# Patient Record
Sex: Male | Born: 1965 | Race: White | Hispanic: No | Marital: Married | Smoking: Never smoker
Health system: Southern US, Community
[De-identification: ages and names within clinical notes are randomized; demographics above are authoritative.]

## PROBLEM LIST (undated history)

## (undated) HISTORY — PX: ABDOMINAL HERNIA REPAIR: SHX539

## (undated) HISTORY — PX: HERNIA REPAIR: SHX51

## (undated) HISTORY — PX: OTHER SURGICAL HISTORY: SHX169

## (undated) HISTORY — PX: VASECTOMY: SHX75

## (undated) HISTORY — PX: KNEE SURGERY: SHX244

---

## 1998-01-11 ENCOUNTER — Ambulatory Visit (HOSPITAL_COMMUNITY): Admission: RE | Admit: 1998-01-11 | Discharge: 1998-01-11 | Payer: Self-pay

## 1998-05-28 ENCOUNTER — Encounter: Payer: Self-pay | Admitting: Emergency Medicine

## 1998-05-28 ENCOUNTER — Emergency Department (HOSPITAL_COMMUNITY): Admission: EM | Admit: 1998-05-28 | Discharge: 1998-05-28 | Payer: Self-pay | Admitting: Emergency Medicine

## 1998-10-20 ENCOUNTER — Encounter: Payer: Self-pay | Admitting: Emergency Medicine

## 1998-10-20 ENCOUNTER — Emergency Department (HOSPITAL_COMMUNITY): Admission: EM | Admit: 1998-10-20 | Discharge: 1998-10-21 | Payer: Self-pay | Admitting: Emergency Medicine

## 2003-05-08 ENCOUNTER — Ambulatory Visit: Admission: RE | Admit: 2003-05-08 | Discharge: 2003-05-08 | Payer: Self-pay | Admitting: Family Medicine

## 2006-01-07 ENCOUNTER — Emergency Department (HOSPITAL_COMMUNITY): Admission: EM | Admit: 2006-01-07 | Discharge: 2006-01-08 | Payer: Self-pay | Admitting: Emergency Medicine

## 2006-08-03 ENCOUNTER — Emergency Department (HOSPITAL_COMMUNITY): Admission: EM | Admit: 2006-08-03 | Discharge: 2006-08-04 | Payer: Self-pay | Admitting: Emergency Medicine

## 2007-01-26 ENCOUNTER — Encounter: Admission: RE | Admit: 2007-01-26 | Discharge: 2007-01-26 | Payer: Self-pay | Admitting: Family Medicine

## 2007-12-05 ENCOUNTER — Encounter (INDEPENDENT_AMBULATORY_CARE_PROVIDER_SITE_OTHER): Payer: Self-pay | Admitting: *Deleted

## 2007-12-05 ENCOUNTER — Ambulatory Visit (HOSPITAL_COMMUNITY): Admission: RE | Admit: 2007-12-05 | Discharge: 2007-12-05 | Payer: Self-pay | Admitting: *Deleted

## 2008-06-13 ENCOUNTER — Encounter: Admission: RE | Admit: 2008-06-13 | Discharge: 2008-06-13 | Payer: Self-pay | Admitting: Family Medicine

## 2008-06-17 ENCOUNTER — Encounter: Admission: RE | Admit: 2008-06-17 | Discharge: 2008-06-17 | Payer: Self-pay | Admitting: Family Medicine

## 2008-08-28 ENCOUNTER — Ambulatory Visit (HOSPITAL_BASED_OUTPATIENT_CLINIC_OR_DEPARTMENT_OTHER): Admission: RE | Admit: 2008-08-28 | Discharge: 2008-08-28 | Payer: Self-pay | Admitting: General Surgery

## 2008-08-28 ENCOUNTER — Encounter (INDEPENDENT_AMBULATORY_CARE_PROVIDER_SITE_OTHER): Payer: Self-pay | Admitting: General Surgery

## 2010-04-25 ENCOUNTER — Encounter: Payer: Self-pay | Admitting: Family Medicine

## 2010-07-10 ENCOUNTER — Emergency Department (HOSPITAL_COMMUNITY)
Admission: EM | Admit: 2010-07-10 | Discharge: 2010-07-11 | Disposition: A | Discharge status: Home / Self Care | Payer: BC Managed Care – PPO | Attending: Emergency Medicine | Admitting: Emergency Medicine

## 2010-07-10 ENCOUNTER — Emergency Department (HOSPITAL_COMMUNITY): Payer: BC Managed Care – PPO

## 2010-07-10 DIAGNOSIS — S61209A Unspecified open wound of unspecified finger without damage to nail, initial encounter: Secondary | ICD-10-CM | POA: Insufficient documentation

## 2010-07-10 DIAGNOSIS — W261XXA Contact with sword or dagger, initial encounter: Secondary | ICD-10-CM | POA: Insufficient documentation

## 2010-07-10 DIAGNOSIS — W260XXA Contact with knife, initial encounter: Secondary | ICD-10-CM | POA: Insufficient documentation

## 2010-07-13 LAB — CBC
HCT: 43.4 % (ref 39.0–52.0)
Hemoglobin: 15.5 g/dL (ref 13.0–17.0)
MCHC: 35.6 g/dL (ref 30.0–36.0)
RBC: 4.82 MIL/uL (ref 4.22–5.81)
RDW: 13.3 % (ref 11.5–15.5)

## 2010-07-13 LAB — DIFFERENTIAL
Basophils Absolute: 0 10*3/uL (ref 0.0–0.1)
Eosinophils Relative: 1 % (ref 0–5)
Lymphocytes Relative: 31 % (ref 12–46)
Monocytes Absolute: 0.6 10*3/uL (ref 0.1–1.0)
Monocytes Relative: 11 % (ref 3–12)
Neutro Abs: 3.3 10*3/uL (ref 1.7–7.7)

## 2010-08-17 NOTE — Op Note (Signed)
Richard Burke, Richard Burke                 ACCOUNT NO.:  [id number]   MEDICAL RECORD NO.:  [id number]          PATIENT TYPE:  AMB   LOCATION:  DAY                          FACILITY:  [facility]   PHYSICIAN:  Alfonse Ras, MD   DATE OF BIRTH:  [birth year]   DATE OF PROCEDURE:  DATE OF DISCHARGE:                               OPERATIVE REPORT   PREOPERATIVE DIAGNOSIS:  Bilateral inguinal hernia.   POSTOPERATIVE DIAGNOSIS:  Bilateral inguinal hernia, bilateral direct.   PROCEDURE:  Bilateral inguinal hernia repair with 3 x 6 Ultrapro mesh.   SURGEON:  Alfonse Ras, M.D.   ANESTHESIA:  General.   DESCRIPTION OF PROCEDURE:  The patient was taken to the operating room  and placed in supine position.  After adequate general anesthesia was  induced using endotracheal tube, bilateral left inguinal regions were  shaved and prepped and draped in normal sterile fashion.  Using an  oblique incision over the left inguinal region, I dissected down through  the skin, subcutaneous tissue using Bovie electrocautery.  External  oblique fascia was opened along its fibers down to the external ring.  Ilioinguinal nerve was identified and retracted laterally.  Spermatic  cord was surrounded with a Penrose drain at the pubic tubercle.  Dissection of the inguinal ligament down to Cooper's ligament was  performed in a blunt fashion and medially under the external oblique  fascia.  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  A direct  inguinal hernia defect was identified and inserted in the cord.  There  was also an indirect hernia defect and sac on that which was dissected  out and ligated at the internal ring with a pursestring of Surgilon  suture.  The floor of Hesselbach triangle was repaired using interrupted  0 Surgilon sutures, approximating the transversalis fascia and muscle to  the inguinal ligament.  A piece of 3 x 6  Ultrapro mesh was then placed  over the repair and tacked medially over the pubic tubercle along the  inguinal ligament and transversalis muscle split and brought out lateral  to the cord.  This was all tacked in place.  The external oblique fascia  was closed with a running 3-0 Vicryl suture.  Skin was closed with  subcuticular 3-0 Monocryl.   I then turned my attention to the right side where a mirror incision was  made.  I dissected down in the similar fashion, the external oblique  fascia was opened along its fibers.  Ilioinguinal nerve was retracted  laterally.  Spermatic cord was surrounded with Penrose drain.  A very  small direct hernia defect was identified and closed primarily  approximating the inguinal ligament and Cooper's ligament to the  transversalis fascia with 3 interrupted sutures.  A piece of 3 x 6  Ultrapro mesh was then placed over the repair and tacked using running 2-  0 Prolene suture  to the tubercle inguinal ligament, transversalis fascia brought out  lateral to the spermatic cord and tacked laterally.  External oblique  fascia was closed with a running 3-0 Vicryl.  Skin was closed with  subcuticular 3-0 Monocryl.  Dermabond dressings were placed.  The  patient tolerated the procedure well and went to PACU in good condition.      Alfonse Ras, MD  Electronically Signed     KRE/MEDQ  D:  12/05/2007  T:  12/05/2007  Job:  [id number]

## 2010-08-17 NOTE — Op Note (Signed)
[name], Richard Burke                 ACCOUNT NO.:  [id number]   MEDICAL RECORD NO.:  [id number]          PATIENT TYPE:  AMB   LOCATION:  DSC                          FACILITY:  [facility]   PHYSICIAN:  Almond Lint, MD       DATE OF BIRTH:  1965/11/17   DATE OF PROCEDURE:  08/28/2008  DATE OF DISCHARGE:                               OPERATIVE REPORT   PREOPERATIVE DIAGNOSIS:  Left cervical lymphadenopathy.   POSTOPERATIVE DIAGNOSIS:  Enlarged L submandibular gland   PROCEDURE PERFORMED:  Excision of L neck mass.   SURGEON:  Almond Lint, MD   ASSISTANT:  None.   ANESTHESIA:  General and local.   FINDINGS:  Two masses, one of which was in the tail of the submandibular  gland.  Excision of left neck masses to pathology.   ESTIMATED BLOOD LOSS:  Minimal.   COMPLICATIONS:  None known.   PROCEDURE IN DETAIL:  [name] was identified in the holding area and  taken to the operating room air where he was placed supine on the  operating room table.  LMA anesthesia was induced.  His neck was turned  to the right and his left neck and ear were prepped and draped in a  sterile fashion.  The abnormality was identified over the side of the  sternocleidomastoid and an incision was marked in a skin crease.  This  was anesthetized with local anesthetic.  A 3-cm incision was made.  The  platysma was entered.  The most prominent area of abnormality was  identified in the tail of the submandibular gland.  This was dissected  sharply away from the greater auricular nerve.  The salivary gland  parenchyma was clipped.  The lesion was removed.  There was still an  area of palpable enlargement just inferior to this.  This was elevated  with an Allis and was removed with combination of sharp dissection and  was clipped on the vascular pedicles.  Again, the greater auricular  nerve was dissected off.  The marginal mandibular nerve was not seen.  There was one small area of venous bleeding which was  clipped.  The area  was irrigated and inspected for hemostasis.  There was none.  There was  also no evidence of lymphatic leakage.  The skin was then  closed using 3-0 Vicryl in a deep dermal fashion and a 4-0 Monocryl in a  running subcuticular fashion.  The wound was then dressed with  Dermabond.  The patient was awakened from anesthesia and taken to PACU  in stable condition.      Almond Lint, MD  Electronically Signed     FB/MEDQ  D:  08/28/2008  T:  08/29/2008  Job:  [id number]

## 2011-04-07 ENCOUNTER — Emergency Department (HOSPITAL_COMMUNITY): Payer: BC Managed Care – PPO

## 2011-04-07 ENCOUNTER — Encounter: Payer: Self-pay | Admitting: *Deleted

## 2011-04-07 ENCOUNTER — Emergency Department (HOSPITAL_COMMUNITY)
Admission: EM | Admit: 2011-04-07 | Discharge: 2011-04-07 | Disposition: A | Discharge status: Home / Self Care | Payer: BC Managed Care – PPO | Attending: Emergency Medicine | Admitting: Emergency Medicine

## 2011-04-07 ENCOUNTER — Other Ambulatory Visit: Payer: Self-pay

## 2011-04-07 DIAGNOSIS — M25519 Pain in unspecified shoulder: Secondary | ICD-10-CM | POA: Insufficient documentation

## 2011-04-07 DIAGNOSIS — R0789 Other chest pain: Secondary | ICD-10-CM

## 2011-04-07 DIAGNOSIS — M542 Cervicalgia: Secondary | ICD-10-CM | POA: Insufficient documentation

## 2011-04-07 DIAGNOSIS — R071 Chest pain on breathing: Secondary | ICD-10-CM | POA: Insufficient documentation

## 2011-04-07 LAB — POCT I-STAT TROPONIN I: Troponin i, poc: 0 ng/mL (ref 0.00–0.08)

## 2011-04-07 LAB — COMPREHENSIVE METABOLIC PANEL
Albumin: 4.3 g/dL (ref 3.5–5.2)
BUN: 16 mg/dL (ref 6–23)
Calcium: 10 mg/dL (ref 8.4–10.5)
Creatinine, Ser: 1.18 mg/dL (ref 0.50–1.35)
GFR calc Af Amer: 85 mL/min — ABNORMAL LOW (ref >90)
Glucose, Bld: 97 mg/dL (ref 70–99)
Total Protein: 7.6 g/dL (ref 6.0–8.3)

## 2011-04-07 LAB — CBC
HCT: 46 % (ref 39.0–52.0)
Hemoglobin: 15.4 g/dL (ref 13.0–17.0)
MCH: 29.4 pg (ref 26.0–34.0)
MCHC: 33.5 g/dL (ref 30.0–36.0)
RDW: 13.1 % (ref 11.5–15.5)

## 2011-04-07 LAB — TROPONIN I
Troponin I: <0.3 ng/mL (ref <0.30)
Troponin I: <0.3 ng/mL (ref <0.30)

## 2011-04-07 MED ORDER — ASPIRIN 325 MG PO TABS
325.0000 mg | ORAL_TABLET | ORAL | Status: AC
  Administered 2011-04-07: 325 mg via ORAL
  Filled 2011-04-07: qty 1

## 2011-04-07 MED ORDER — DIAZEPAM 5 MG PO TABS: 5.0000 mg | ORAL_TABLET | Freq: Two times a day (BID) | ORAL | Status: AC

## 2011-04-07 MED ORDER — MORPHINE SULFATE 4 MG/ML IJ SOLN
4.0000 mg | Freq: Once | INTRAMUSCULAR | Status: AC
  Administered 2011-04-07: 4 mg via INTRAVENOUS
  Filled 2011-04-07: qty 1

## 2011-04-07 MED ORDER — NITROGLYCERIN 0.4 MG SL SUBL
0.4000 mg | SUBLINGUAL_TABLET | SUBLINGUAL | Status: DC | PRN
  Administered 2011-04-07 (×3): 0.4 mg via SUBLINGUAL

## 2011-04-07 MED ORDER — HYDROCODONE-ACETAMINOPHEN 5-325 MG PO TABS: 1.0000 | ORAL_TABLET | ORAL | Status: AC | PRN

## 2011-04-07 MED ORDER — NITROGLYCERIN 0.4 MG SL SUBL
SUBLINGUAL_TABLET | SUBLINGUAL | Status: AC
  Administered 2011-04-07: 0.4 mg via SUBLINGUAL
  Filled 2011-04-07: qty 25

## 2011-04-07 NOTE — ED Notes (Signed)
Pt given 3 nitro tablets. C/o pain 5/10 when last nitro tablet given. Sharp pain.

## 2011-04-07 NOTE — ED Notes (Signed)
Pt states "driving to [place], began having pain in my left upper arm, left upper back hurts to grit my teeth and left cp"; pt denies n/v/diaphoresis

## 2011-04-07 NOTE — ED Notes (Signed)
Patient given discharge instructions, information, prescriptions, and diet order. Patient states that they adequately understand discharge information given and to return to ED if symptoms return or worsen.     

## 2011-04-07 NOTE — ED Provider Notes (Signed)
11:53 AM  Date: 04/07/2011  Rate: 59  Rhythm: sinus bradycardia  QRS Axis: normal  Intervals: normal  ST/T Wave abnormalities: normal  Conduction Disutrbances:none  Narrative Interpretation: Normal EKG  Old EKG Reviewed: unchanged    Carleene Cooper III, MD 04/07/11 1154

## 2011-04-07 NOTE — ED Provider Notes (Signed)
History     CSN: [id number]  Arrival date & time 04/07/11  1018   First MD Initiated Contact with Patient 04/07/11 1415      Chief Complaint  Patient presents with  . Neck Pain    left  . Shoulder Pain    left  . Chest Pain   HPI Patient complains of left sided chest discomfort, neck pain, left arm pain that started while driving to work today. Reports that it is worse with movement. No diaphoresis, nausea, vomiting, fevers, cough or shortness of breath. Reports pain is worse with palpation. No recent travel or surgeries. Patient followed by Dr. Laurine Blazer, no hyperlipidemia, no smoking, no hypertension. Denies any exertional chest pain. Denies any previous history of chest pain.   History reviewed. No pertinent past medical history.  Past Surgical History  Procedure Date  . Abdominal hernia repair   . Knee surgery     right  . Salivary gland removal   . Vasectomy     History reviewed. No pertinent family history.  History  Substance Use Topics  . Smoking status: Never Smoker   . Smokeless tobacco: Not on file  . Alcohol Use: No      Review of Systems  Constitutional: Negative for fever, chills, diaphoresis and appetite change.  HENT: Negative for neck pain.   Eyes: Negative for photophobia and visual disturbance.  Respiratory: Negative for cough, chest tightness and shortness of breath.   Cardiovascular: Negative for chest pain.  Gastrointestinal: Negative for nausea, vomiting, abdominal pain, constipation and blood in stool.  Genitourinary: Negative for flank pain.  Musculoskeletal: Negative for back pain, joint swelling and gait problem.  Skin: Negative for rash.  Neurological: Negative for dizziness, facial asymmetry, weakness and numbness.  All other systems reviewed and are negative.    Allergies  Review of patient's allergies indicates no known allergies.  Home Medications   Current Outpatient Rx  Name Route Sig Dispense Refill  . NAPROXEN SODIUM 220  MG PO TABS Oral Take 220 mg by mouth 2 (two) times daily with a meal. pain       BP 129/90  Pulse 80  Temp(Src) 97.7 F (36.5 C) (Oral)  Resp 20  Wt 170 lb (77.111 kg)  SpO2 99%  Physical Exam  Nursing note and vitals reviewed. Constitutional: He is oriented to person, place, and time. He appears well-developed and well-nourished. No distress.  HENT:  Head: Normocephalic and atraumatic.  Mouth/Throat: Oropharynx is clear and moist.  Eyes: EOM are normal. Pupils are equal, round, and reactive to light.  Neck: Normal range of motion. Neck supple.  Cardiovascular: Normal rate, regular rhythm, normal heart sounds and intact distal pulses.  Exam reveals no gallop and no friction rub.   No murmur heard. Pulmonary/Chest: Effort normal and breath sounds normal. No respiratory distress. He has no wheezes. He exhibits tenderness.       Tender to palpation to left shoulder with ROM. Tender to palpation to the left/right chest to palpation.   Abdominal: Soft. Bowel sounds are normal. There is no tenderness. There is no rebound and no guarding.  Musculoskeletal: Normal range of motion. He exhibits tenderness. He exhibits no edema.       Full sensation.   Neurological: He is alert and oriented to person, place, and time. He displays normal reflexes. No cranial nerve deficit or sensory deficit. He exhibits normal muscle tone. He displays a negative Romberg sign. Coordination and gait normal. GCS eye subscore is 4. GCS  verbal subscore is 5. GCS motor subscore is 6. He displays no Babinski's sign on the right side. He displays no Babinski's sign on the left side.       Normal finger to nose testing. Normal grip. No pronator drift. No nystagmus on examination.  Skin: Skin is warm and dry. No rash noted. He is not diaphoretic. No erythema. No pallor.  Psychiatric: He has a normal mood and affect. His behavior is normal. Judgment and thought content normal.    ED Course  Procedures (including critical  care time)  Patient seen and evaluated.  VSS reviewed. . Nursing notes reviewed. Discussed with attending physician. Initial testing ordered. Will monitor the patient closely. They agree with the treatment plan and diagnosis.   Results for orders placed during the hospital encounter of 04/07/11  CBC      Component Value Range   WBC 5.0  4.0 - 10.5 (K/uL)   RBC 5.24  4.22 - 5.81 (MIL/uL)   Hemoglobin 15.4  13.0 - 17.0 (g/dL)   HCT 96.2  95.2 - 84.1 (%)   MCV 87.8  78.0 - 100.0 (fL)   MCH 29.4  26.0 - 34.0 (pg)   MCHC 33.5  30.0 - 36.0 (g/dL)   RDW 32.4  40.1 - 02.7 (%)   Platelets 235  150 - 400 (K/uL)  COMPREHENSIVE METABOLIC PANEL      Component Value Range   Sodium 136  135 - 145 (mEq/L)   Potassium 4.2  3.5 - 5.1 (mEq/L)   Chloride 101  96 - 112 (mEq/L)   CO2 25  19 - 32 (mEq/L)   Glucose, Bld 97  70 - 99 (mg/dL)   BUN 16  6 - 23 (mg/dL)   Creatinine, Ser 2.53  0.50 - 1.35 (mg/dL)   Calcium 66.4  8.4 - 10.5 (mg/dL)   Total Protein 7.6  6.0 - 8.3 (g/dL)   Albumin 4.3  3.5 - 5.2 (g/dL)   AST 17  0 - 37 (U/L)   ALT 26  0 - 53 (U/L)   Alkaline Phosphatase 47  39 - 117 (U/L)   Total Bilirubin 0.6  0.3 - 1.2 (mg/dL)   GFR calc non Af Amer 74 (*) >90 (mL/min)   GFR calc Af Amer 85 (*) >90 (mL/min)  TROPONIN I      Component Value Range   Troponin I <0.30  <0.30 (ng/mL)  POCT I-STAT TROPONIN I      Component Value Range   Troponin i, poc 0.00  0.00 - 0.08 (ng/mL)   Comment 3            Dg Chest 2 View  04/07/2011  *RADIOLOGY REPORT*  Clinical Data: Chest pain.  CHEST - 2 VIEW  Comparison: Chest x-ray 10/16/2010.  Findings: The cardiac silhouette, mediastinal and hilar contours are within normal limits and stable.  The lungs are clear.  No pleural effusion.  The bony thorax is intact.  IMPRESSION: No acute cardiopulmonary findings.  Original Report Authenticated By: P. Loralie Champagne, M.D.    Patient seen and re-evaluated. Resting comfortably. VSS stable. NAD. Patient notified  of testing results. Stated agreement and understanding. Patient stated understanding to treatment plan and diagnosis. 3 hour cardiac marker is negative.    PERC negative. No recent travel or surgery  6:05 PM patient is chest pain free at this time and is requesting to go home. All labs and x-ray are WNL. No abnormalities on ECG  MDM  Chest pain, 0, 1, 3 hour cardiac marker are negative. No exertional chest pain, no SOB or diaphoresis. Reproducible pain, consistent with musculoskeletal pain. No pain with inspiration, PERC negative. PE unlikely at this time. Advised patient of warning signs to return. Stated agreement and understanding.     Demetrius Charity, PA 04/07/11 1805

## 2011-04-08 NOTE — ED Provider Notes (Signed)
Medical screening examination/treatment/procedure(s) were performed by non-physician practitioner and as supervising physician I was immediately available for consultation/collaboration.  Kailie Polus L Raider Valbuena, MD 04/08/11 0029 

## 2011-04-19 ENCOUNTER — Ambulatory Visit: Payer: BC Managed Care – PPO | Attending: Family Medicine

## 2011-04-19 DIAGNOSIS — IMO0001 Reserved for inherently not codable concepts without codable children: Secondary | ICD-10-CM | POA: Insufficient documentation

## 2011-04-19 DIAGNOSIS — M542 Cervicalgia: Secondary | ICD-10-CM | POA: Insufficient documentation

## 2011-04-19 DIAGNOSIS — R5381 Other malaise: Secondary | ICD-10-CM | POA: Insufficient documentation

## 2011-04-22 ENCOUNTER — Encounter: Payer: BC Managed Care – PPO | Admitting: Physical Therapy

## 2011-04-26 ENCOUNTER — Ambulatory Visit: Payer: BC Managed Care – PPO | Admitting: Physical Therapy

## 2011-05-05 ENCOUNTER — Encounter: Payer: BC Managed Care – PPO | Admitting: Physical Therapy

## 2012-08-31 ENCOUNTER — Other Ambulatory Visit: Payer: Self-pay | Admitting: Otolaryngology

## 2013-10-19 ENCOUNTER — Encounter (HOSPITAL_BASED_OUTPATIENT_CLINIC_OR_DEPARTMENT_OTHER): Payer: Self-pay | Admitting: Emergency Medicine

## 2013-10-19 ENCOUNTER — Emergency Department (HOSPITAL_BASED_OUTPATIENT_CLINIC_OR_DEPARTMENT_OTHER)
Admission: EM | Admit: 2013-10-19 | Discharge: 2013-10-19 | Disposition: A | Discharge status: Home / Self Care | Payer: BC Managed Care – PPO | Attending: Emergency Medicine | Admitting: Emergency Medicine

## 2013-10-19 DIAGNOSIS — W268XXA Contact with other sharp object(s), not elsewhere classified, initial encounter: Secondary | ICD-10-CM | POA: Insufficient documentation

## 2013-10-19 DIAGNOSIS — Z791 Long term (current) use of non-steroidal anti-inflammatories (NSAID): Secondary | ICD-10-CM | POA: Insufficient documentation

## 2013-10-19 DIAGNOSIS — Y9389 Activity, other specified: Secondary | ICD-10-CM | POA: Insufficient documentation

## 2013-10-19 DIAGNOSIS — S61209A Unspecified open wound of unspecified finger without damage to nail, initial encounter: Secondary | ICD-10-CM | POA: Insufficient documentation

## 2013-10-19 DIAGNOSIS — Y929 Unspecified place or not applicable: Secondary | ICD-10-CM | POA: Insufficient documentation

## 2013-10-19 DIAGNOSIS — IMO0002 Reserved for concepts with insufficient information to code with codable children: Secondary | ICD-10-CM

## 2013-10-19 NOTE — ED Notes (Signed)
Pt reports he cut himself while cutting food

## 2013-10-19 NOTE — ED Provider Notes (Signed)
TIME SEEN: 10:20 PM  CHIEF COMPLAINT: laceration  HPI Comments: Richard Burke is a 48 y.o. male no significant past medical history who presents to the Emergency Department complaining of a left pinky laceration onset 6 hours ago. He reports that he was peeling potatoes when he sliced his finger. He reports that his last tdap was approximately 2 years ago. He states that bleeding has been controlled. No other injury. He is right-hand dominant. No numbness, tingling or weakness.  ROS: See HPI Constitutional: no fever  Eyes: no drainage  ENT: no runny nose   Cardiovascular:  no chest pain  Resp: no SOB  GI: no vomiting GU: no dysuria Integumentary: no rash  Allergy: no hives  Musculoskeletal: no leg swelling  Neurological: no slurred speech ROS otherwise negative  PAST MEDICAL HISTORY/PAST SURGICAL HISTORY:  History reviewed. No pertinent past medical history.  MEDICATIONS:  Prior to Admission medications   Medication Sig Start Date End Date Taking? Authorizing Provider  naproxen sodium (ANAPROX) 220 MG tablet Take 220 mg by mouth 2 (two) times daily with a meal. pain     Historical Provider, MD    ALLERGIES:  No Known Allergies  SOCIAL HISTORY:  History  Substance Use Topics  . Smoking status: Never Smoker   . Smokeless tobacco: Not on file  . Alcohol Use: No    FAMILY HISTORY: History reviewed. No pertinent family history.  EXAM: BP 134/97  Pulse 81  Temp(Src) 98.6 F (37 C) (Oral)  Resp 20  Ht 5\' 7"  (1.702 m)  Wt 166 lb (75.297 kg)  BMI 25.99 kg/m2  SpO2 98% CONSTITUTIONAL: Alert and oriented and responds appropriately to questions. Well-appearing; well-nourished; GCS 15 HEAD: Normocephalic; atraumatic EYES: Conjunctivae clear, PERRL, EOMI ENT: normal nose; no rhinorrhea; moist mucous membranes; pharynx without lesions noted; no dental injury; no hemotypanum; no septal hematoma NECK: Supple, no meningismus, no LAD; no midline spinal tenderness, step-off or  deformity CARD: RRR; S1 and S2 appreciated; no murmurs, no clicks, no rubs, no gallops RESP: Normal chest excursion without splinting or tachypnea; breath sounds clear and equal bilaterally; no wheezes, no rhonchi, no rales; chest wall stable, nontender to palpation ABD/GI: Normal bowel sounds; non-distended; soft, non-tender, no rebound, no guarding PELVIS:  stable, nontender to palpation BACK:  The back appears normal and is non-tender to palpation, there is no CVA tenderness; no midline spinal tenderness, step-off or deformity EXT: Normal ROM in all joints; non-tender to palpation; no edema; normal capillary refill; no cyanosis but 2+ radial pulses bilaterally, sensation to light touch intact diffusely, normal grip strength bilaterally    SKIN: Normal color for age and race; warm. 0.5 cm laceration to left palmar aspect of left pinky finger that is hemostatic and very superficial.  NEURO: Moves all extremities equally PSYCH: The patient's mood and manner are appropriate. Grooming and personal hygiene are appropriate.  MEDICAL DECISION MAKING: Patient here with superficial laceration that has been repaired with Dermabond. No other injury. Neurovascular intact distally. We'll apply dressing. Discussed return precautions and supportive care instructions. He verbalizes understanding is comfortable with plan.  ED PROGRESS:  LACERATION REPAIR PROCEDURE NOTE The patient's identification was confirmed and consent was obtained. This procedure was performed by Layla MawKristen N Khyler Urda, DO at 10:28 PM. Site: Left fifth finger Sterile procedures observed Anesthetic used (type and amt): None Suture type/size: Dermabond Length: 0.5 cm # of Sutures: 0 Technique: Area cleaned and Dermabond applied Complexity superficial Tetanus UTD Site anesthetized, irrigated with NS, explored without evidence of  foreign body, wound well approximated, site covered with dry, sterile dressing.  Patient tolerated procedure well  without complications. Instructions for care discussed verbally and patient provided with additional written instructions for homecare and f/u.     [name], DO 10/19/13 2357

## 2013-10-19 NOTE — Discharge Instructions (Signed)

## 2015-08-19 ENCOUNTER — Encounter (HOSPITAL_BASED_OUTPATIENT_CLINIC_OR_DEPARTMENT_OTHER): Payer: Self-pay | Admitting: *Deleted

## 2015-08-19 ENCOUNTER — Emergency Department (HOSPITAL_BASED_OUTPATIENT_CLINIC_OR_DEPARTMENT_OTHER)
Admission: EM | Admit: 2015-08-19 | Discharge: 2015-08-19 | Disposition: A | Discharge status: Home / Self Care | Payer: BLUE CROSS/BLUE SHIELD | Attending: Emergency Medicine | Admitting: Emergency Medicine

## 2015-08-19 DIAGNOSIS — J392 Other diseases of pharynx: Secondary | ICD-10-CM | POA: Insufficient documentation

## 2015-08-19 DIAGNOSIS — J019 Acute sinusitis, unspecified: Secondary | ICD-10-CM | POA: Insufficient documentation

## 2015-08-19 DIAGNOSIS — R05 Cough: Secondary | ICD-10-CM | POA: Diagnosis present

## 2015-08-19 DIAGNOSIS — R059 Cough, unspecified: Secondary | ICD-10-CM

## 2015-08-19 MED ORDER — LORATADINE 10 MG PO TABS: 10.0000 mg | ORAL_TABLET | Freq: Every day | ORAL | Status: AC

## 2015-08-19 MED ORDER — AMOXICILLIN-POT CLAVULANATE 875-125 MG PO TABS: 1.0000 | ORAL_TABLET | Freq: Two times a day (BID) | ORAL | Status: AC

## 2015-08-19 MED ORDER — HYDROCODONE-HOMATROPINE 5-1.5 MG/5ML PO SYRP: 5.0000 mL | ORAL_SOLUTION | Freq: Four times a day (QID) | ORAL | Status: AC | PRN

## 2015-08-19 NOTE — ED Provider Notes (Signed)
CSN: [id number]     Arrival date & time 08/19/15  [id number] History   First MD Initiated Contact with Patient 08/19/15 [phone]     Chief Complaint  Patient presents with  . Cough     (Consider location/radiation/quality/duration/timing/severity/associated sxs/prior Treatment) Patient is a 50 y.o. male presenting with cough.  Cough Cough characteristics:  Productive Sputum characteristics:  Manson PasseyBrown and gray Severity:  Mild Onset quality:  Gradual Duration:  6 days Timing:  Constant Progression:  Worsening Chronicity:  New Smoker: no   Relieved by:  None tried Worsened by:  Nothing tried Associated symptoms: chills, fever, rhinorrhea, shortness of breath and sore throat   Associated symptoms: no chest pain and no headaches     History reviewed. No pertinent past medical history. Past Surgical History  Procedure Laterality Date  . Abdominal hernia repair    . Knee surgery      right  . Salivary gland removal    . Vasectomy     No family history on file. Social History  Substance Use Topics  . Smoking status: Never Smoker   . Smokeless tobacco: None  . Alcohol Use: No    Review of Systems  Constitutional: Positive for fever and chills. Negative for activity change.  HENT: Positive for congestion, postnasal drip, rhinorrhea and sore throat.   Eyes: Negative for visual disturbance.  Respiratory: Positive for cough and shortness of breath.   Cardiovascular: Negative for chest pain.  Gastrointestinal: Negative for vomiting, abdominal pain, diarrhea and constipation.  Endocrine: Negative for polyuria.  Genitourinary: Negative for dysuria and flank pain.  Musculoskeletal: Negative for back pain and neck pain.  Skin: Negative for wound.  Neurological: Negative for headaches.  All other systems reviewed and are negative.     Allergies  Review of patient's allergies indicates no known allergies.  Home Medications   Prior to Admission medications   Medication Sig Start Date  End Date Taking? Authorizing Provider  amoxicillin-clavulanate (AUGMENTIN) 875-125 MG tablet Take 1 tablet by mouth 2 (two) times daily. One po bid x 7 days 08/19/15   Marily MemosJason Carlisa Eble, MD  HYDROcodone-homatropine [facility](HYCODAN) 5-1.5 MG/5ML syrup Take 5 mLs by mouth every 6 (six) hours as needed for cough. 08/19/15   Marily MemosJason Keera Altidor, MD  loratadine (CLARITIN) 10 MG tablet Take 1 tablet (10 mg total) by mouth daily. 08/19/15   Barbara CowerJason Rose-Marie Hickling, MD   BP 133/102 mmHg  Pulse 71  Temp(Src) 98.3 F (36.8 C) (Oral)  Resp 18  Ht 5\' 6"  (1.676 m)  Wt 175 lb (79.379 kg)  BMI 28.26 kg/m2  SpO2 97% Physical Exam  Constitutional: He is oriented to person, place, and time. He appears well-developed and well-nourished.  HENT:  Head: Normocephalic and atraumatic.  Nose: Mucosal edema and rhinorrhea present. Right sinus exhibits frontal sinus tenderness. Left sinus exhibits frontal sinus tenderness.  Mouth/Throat: Posterior oropharyngeal erythema present.  Neck: Normal range of motion.  Cardiovascular: Normal rate.   Pulmonary/Chest: Effort normal. No respiratory distress. He has no wheezes. He has no rales.  Abdominal: He exhibits no distension. There is no tenderness.  Musculoskeletal: Normal range of motion. He exhibits no edema or tenderness.  Neurological: He is alert and oriented to person, place, and time.  Skin: Skin is warm and dry.  Nursing note and vitals reviewed.   ED Course  Procedures (including critical care time) Labs Review Labs Reviewed - No data to display  Imaging Review No results found. I have personally reviewed and evaluated these images and  lab results as part of my medical decision-making.   EKG Interpretation None      MDM   Final diagnoses:  Cough  Acute sinusitis, recurrence not specified, unspecified location    Allergies v sinusitis. Will try claritin for a few days and if no improvement will start abx.   New Prescriptions: New Prescriptions    AMOXICILLIN-CLAVULANATE (AUGMENTIN) 875-125 MG TABLET    Take 1 tablet by mouth 2 (two) times daily. One po bid x 7 days   HYDROCODONE-HOMATROPINE (HYCODAN) 5-1.5 MG/5ML SYRUP    Take 5 mLs by mouth every 6 (six) hours as needed for cough.   LORATADINE (CLARITIN) 10 MG TABLET    Take 1 tablet (10 mg total) by mouth daily.     I have personally and contemperaneously reviewed labs and imaging and used in my decision making as above.   A medical screening exam was performed and I feel the patient has had an appropriate workup for their chief complaint at this time and likelihood of emergent condition existing is low and thus workup can continue on an outpatient basis.. Their vital signs are stable. They have been counseled on decision, discharge, follow up and which symptoms necessitate immediate return to the emergency department.  They verbally stated understanding and agreement with plan and discharged in stable condition.      Marily Memos, MD 08/19/15 [phone]

## 2015-08-19 NOTE — ED Notes (Signed)
C/o coughing since Saturday. Productive cough of tan sputum. C/o sore throat and low grade fever.

## 2018-04-02 ENCOUNTER — Emergency Department (HOSPITAL_BASED_OUTPATIENT_CLINIC_OR_DEPARTMENT_OTHER): Payer: BLUE CROSS/BLUE SHIELD

## 2018-04-02 ENCOUNTER — Emergency Department (HOSPITAL_COMMUNITY)
Admission: EM | Admit: 2018-04-02 | Discharge: 2018-04-02 | Discharge status: Absent without leave | Payer: BLUE CROSS/BLUE SHIELD

## 2018-04-02 ENCOUNTER — Emergency Department (HOSPITAL_BASED_OUTPATIENT_CLINIC_OR_DEPARTMENT_OTHER)
Admission: EM | Admit: 2018-04-02 | Discharge: 2018-04-02 | Disposition: A | Discharge status: Home / Self Care | Payer: BLUE CROSS/BLUE SHIELD | Attending: Emergency Medicine | Admitting: Emergency Medicine

## 2018-04-02 ENCOUNTER — Other Ambulatory Visit: Payer: Self-pay

## 2018-04-02 ENCOUNTER — Encounter (HOSPITAL_BASED_OUTPATIENT_CLINIC_OR_DEPARTMENT_OTHER): Payer: Self-pay | Admitting: *Deleted

## 2018-04-02 DIAGNOSIS — R1013 Epigastric pain: Secondary | ICD-10-CM

## 2018-04-02 DIAGNOSIS — Z79899 Other long term (current) drug therapy: Secondary | ICD-10-CM | POA: Insufficient documentation

## 2018-04-02 DIAGNOSIS — K529 Noninfective gastroenteritis and colitis, unspecified: Secondary | ICD-10-CM | POA: Diagnosis not present

## 2018-04-02 LAB — CBC WITH DIFFERENTIAL/PLATELET
ABS IMMATURE GRANULOCYTES: 0.06 10*3/uL (ref 0.00–0.07)
Basophils Absolute: 0 10*3/uL (ref 0.0–0.1)
Basophils Relative: 0 %
Eosinophils Absolute: 0 10*3/uL (ref 0.0–0.5)
Eosinophils Relative: 0 %
HCT: 53.9 % — ABNORMAL HIGH (ref 39.0–52.0)
Hemoglobin: 17.6 g/dL — ABNORMAL HIGH (ref 13.0–17.0)
Immature Granulocytes: 0 %
Lymphocytes Relative: 5 %
Lymphs Abs: 0.8 10*3/uL (ref 0.7–4.0)
MCH: 29.9 pg (ref 26.0–34.0)
MCHC: 32.7 g/dL (ref 30.0–36.0)
MCV: 91.7 fL (ref 80.0–100.0)
MONO ABS: 0.8 10*3/uL (ref 0.1–1.0)
MONOS PCT: 5 %
Neutro Abs: 14.3 10*3/uL — ABNORMAL HIGH (ref 1.7–7.7)
Neutrophils Relative %: 90 %
Platelets: 299 10*3/uL (ref 150–400)
RBC: 5.88 MIL/uL — ABNORMAL HIGH (ref 4.22–5.81)
RDW: 13.2 % (ref 11.5–15.5)
WBC: 15.9 10*3/uL — ABNORMAL HIGH (ref 4.0–10.5)
nRBC: 0 % (ref 0.0–0.2)

## 2018-04-02 LAB — COMPREHENSIVE METABOLIC PANEL
ALT: 39 U/L (ref 0–44)
AST: 30 U/L (ref 15–41)
Albumin: 4.9 g/dL (ref 3.5–5.0)
Alkaline Phosphatase: 45 U/L (ref 38–126)
Anion gap: 11 (ref 5–15)
BUN: 24 mg/dL — ABNORMAL HIGH (ref 6–20)
CO2: 23 mmol/L (ref 22–32)
CREATININE: 1.11 mg/dL (ref 0.61–1.24)
Calcium: 9.6 mg/dL (ref 8.9–10.3)
Chloride: 103 mmol/L (ref 98–111)
GFR calc Af Amer: >60 mL/min (ref >60)
GFR calc non Af Amer: >60 mL/min (ref >60)
Glucose, Bld: 188 mg/dL — ABNORMAL HIGH (ref 70–99)
Potassium: 4 mmol/L (ref 3.5–5.1)
Sodium: 137 mmol/L (ref 135–145)
Total Bilirubin: 1.3 mg/dL — ABNORMAL HIGH (ref 0.3–1.2)
Total Protein: 8.6 g/dL — ABNORMAL HIGH (ref 6.5–8.1)

## 2018-04-02 LAB — LIPASE, BLOOD: Lipase: 21 U/L (ref 11–51)

## 2018-04-02 MED ORDER — ONDANSETRON HCL 4 MG/2ML IJ SOLN
4.0000 mg | Freq: Once | INTRAMUSCULAR | Status: AC
  Administered 2018-04-02: 4 mg via INTRAVENOUS
  Filled 2018-04-02: qty 2

## 2018-04-02 MED ORDER — SODIUM CHLORIDE 0.9 % IV BOLUS
1000.0000 mL | Freq: Once | INTRAVENOUS | Status: AC
  Administered 2018-04-02: 1000 mL via INTRAVENOUS

## 2018-04-02 MED ORDER — MORPHINE SULFATE (PF) 4 MG/ML IV SOLN
4.0000 mg | Freq: Once | INTRAVENOUS | Status: AC
  Administered 2018-04-02: 4 mg via INTRAVENOUS
  Filled 2018-04-02: qty 1

## 2018-04-02 MED ORDER — IOPAMIDOL (ISOVUE-300) INJECTION 61%
100.0000 mL | Freq: Once | INTRAVENOUS | Status: AC
  Administered 2018-04-02: 100 mL via INTRAVENOUS

## 2018-04-02 MED ORDER — ONDANSETRON 4 MG PO TBDP: 4.0000 mg | ORAL_TABLET | Freq: Once | ORAL | Status: DC | PRN

## 2018-04-02 MED ORDER — ONDANSETRON 8 MG PO TBDP: ORAL_TABLET | ORAL | 0 refills | Status: AC

## 2018-04-02 MED ORDER — ONDANSETRON 8 MG PO TBDP: ORAL_TABLET | ORAL | 0 refills | Status: DC

## 2018-04-02 NOTE — ED Notes (Signed)
ED Provider at bedside. 

## 2018-04-02 NOTE — Discharge Instructions (Addendum)
Zofran as prescribed as needed for nausea.  Clear liquid diet as tolerated for the next 12 hours, then slowly advance to crackers, toast, and bland foods.  Return to the emergency department if you develop worsening pain, bloody stool or vomit, high fevers, or other new and concerning symptoms.

## 2018-04-02 NOTE — ED Notes (Signed)
Patient transported to CT 

## 2018-04-02 NOTE — ED Provider Notes (Signed)
[facility] EMERGENCY DEPARTMENT Provider Note   CSN: [id number] Arrival date & time: 04/02/18  0258     History   Chief Complaint Chief Complaint  Patient presents with  . Emesis    HPI Runell GessScott Whittley is a 52 y.o. male.  Patient is a 52 year old male with history of prior hernia repair.  He presents today for evaluation of abdominal pain and vomiting.  This started yesterday evening and is rapidly worsening.  He describes eating a taco yesterday afternoon for lunch, then began to vomit last night.  He denies any bloody emesis.  He denies any diarrhea or constipation.  His pain is epigastric.  The history is provided by the patient.  Emesis   This is a new problem. Episode onset: 9 PM. The problem occurs continuously. The problem has been rapidly worsening. The emesis has an appearance of stomach contents. There has been no fever. Associated symptoms include abdominal pain. Pertinent negatives include no chills, no diarrhea and no fever.    History reviewed. No pertinent past medical history.  There are no active problems to display for this patient.   Past Surgical History:  Procedure Laterality Date  . ABDOMINAL HERNIA REPAIR    . HERNIA REPAIR    . KNEE SURGERY     right  . salivary gland removal    . VASECTOMY          Home Medications    Prior to Admission medications   Medication Sig Start Date End Date Taking? Authorizing Provider  amoxicillin-clavulanate (AUGMENTIN) 875-125 MG tablet Take 1 tablet by mouth 2 (two) times daily. One po bid x 7 days 08/19/15   Mesner, Barbara CowerJason, MD  HYDROcodone-homatropine [facility](HYCODAN) 5-1.5 MG/5ML syrup Take 5 mLs by mouth every 6 (six) hours as needed for cough. 08/19/15   Mesner, Barbara CowerJason, MD  loratadine (CLARITIN) 10 MG tablet Take 1 tablet (10 mg total) by mouth daily. 08/19/15   Mesner, Barbara CowerJason, MD    Family History No family history on file.  Social History Social History   Tobacco Use  . Smoking status: Never Smoker    . Smokeless tobacco: Never Used  Substance Use Topics  . Alcohol use: No  . Drug use: No     Allergies   Patient has no known allergies.   Review of Systems Review of Systems  Constitutional: Negative for chills and fever.  Gastrointestinal: Positive for abdominal pain and vomiting. Negative for diarrhea.  All other systems reviewed and are negative.    Physical Exam Updated Vital Signs BP (!) 154/117 (BP Location: Right Arm)   Pulse 93   Temp 98.3 F (36.8 C) (Oral)   Resp (!) 21   Ht 5\' 6"  (1.676 m)   Wt 81.6 kg   SpO2 98%   BMI 29.05 kg/m   Physical Exam Vitals signs and nursing note reviewed.  Constitutional:      General: He is not in acute distress.    Appearance: He is well-developed. He is not diaphoretic.  HENT:     Head: Normocephalic and atraumatic.  Neck:     Musculoskeletal: Normal range of motion and neck supple.  Cardiovascular:     Rate and Rhythm: Normal rate and regular rhythm.     Heart sounds: No murmur. No friction rub.  Pulmonary:     Effort: Pulmonary effort is normal. No respiratory distress.     Breath sounds: Normal breath sounds. No wheezing or rales.  Abdominal:     General:  Bowel sounds are normal. There is no distension.     Palpations: Abdomen is soft.     Tenderness: There is abdominal tenderness. There is no guarding or rebound.     Comments: There is tenderness to palpation in the epigastric region.  Musculoskeletal: Normal range of motion.  Skin:    General: Skin is warm and dry.  Neurological:     Mental Status: He is alert and oriented to person, place, and time.     Coordination: Coordination normal.      ED Treatments / Results  Labs (all labs ordered are listed, but only abnormal results are displayed) Labs Reviewed  COMPREHENSIVE METABOLIC PANEL  LIPASE, BLOOD  CBC WITH DIFFERENTIAL/PLATELET    EKG None  Radiology No results found.  Procedures Procedures (including critical care  time)  Medications Ordered in ED Medications  sodium chloride 0.9 % bolus 1,000 mL (has no administration in time range)  ondansetron (ZOFRAN) injection 4 mg (has no administration in time range)  morphine 4 MG/ML injection 4 mg (has no administration in time range)     Initial Impression / Assessment and Plan / ED Course  I have reviewed the triage vital signs and the nursing notes.  Pertinent labs & imaging results that were available during my care of the patient were reviewed by me and considered in my medical decision making (see chart for details).  Patient presents here with nausea, vomiting, and epigastric abdominal pain.  His work-up reveals a white count of 16,000, but is otherwise essentially unremarkable.  CT scan shows no evidence for obstruction or other acute intra-abdominal pathology.  Patient is feeling better after receiving medications and fluids in the ER.  This illness may be viral in nature.  He will be discharged with Zofran and is to follow-up as needed if symptoms worsen.  Final Clinical Impressions(s) / ED Diagnoses   Final diagnoses:  None    ED Discharge Orders    None       Geoffery Lyonselo, Rayven Hendrickson, MD 04/02/18 [phone]

## 2018-04-02 NOTE — ED Triage Notes (Signed)
C/o vomiting that started at 2100. C/o upper abd pain. Describes as constant and sharp. Denies any diarrhea or fevers. Last ate a taco at 1pm yesterday. Denies any hx of gallbladder issues.

## 2020-08-26 IMAGING — CT CT ABD-PELV W/ CM
2 of 5 series · 16 of 46 positions shown, 18 images · IV contrast (APPLIED)
Comparison: CT of the abdomen and pelvis performed 01/08/2006

CLINICAL DATA: Acute onset of upper abdominal pain and vomiting.

EXAM:
CT ABDOMEN AND PELVIS WITH CONTRAST
TECHNIQUE: Multidetector CT imaging of the abdomen and pelvis was performed
using the standard protocol following bolus administration of
intravenous contrast.
CONTRAST:  100mL [id number] IOPAMIDOL ([id number]) INJECTION 61%

[Series 2: axial st · axial · 0.71mm/px · z∈[+898,+1323]mm · 13 of 96 slices shown, 15 images]
[im 6/96  soft-tissue]
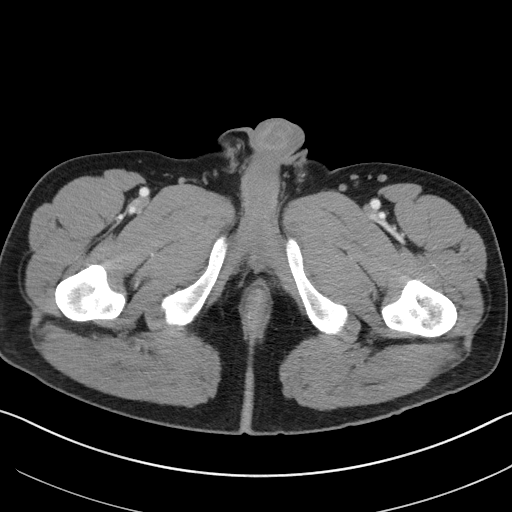
[im 6/96  bone]
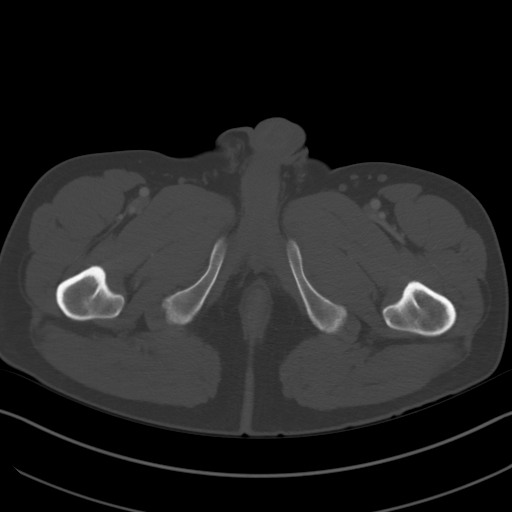
[im 16/96  soft-tissue]
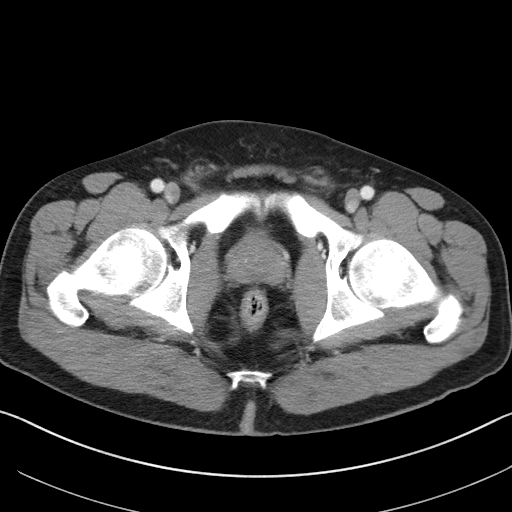
[im 21/96  soft-tissue]
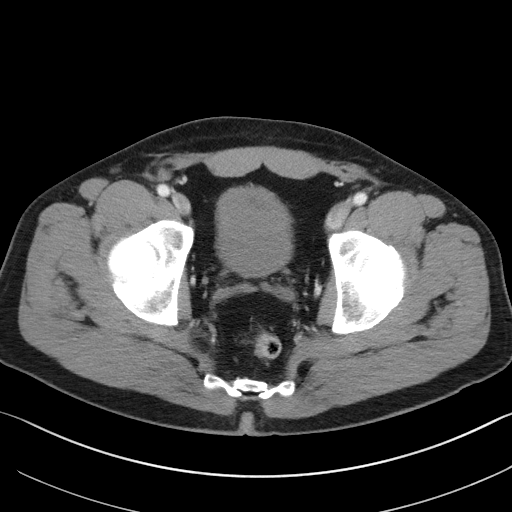
[im 26/96  soft-tissue]
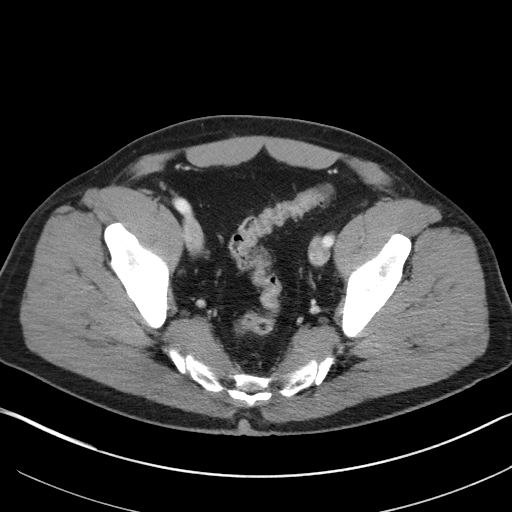
[im 36/96  soft-tissue]
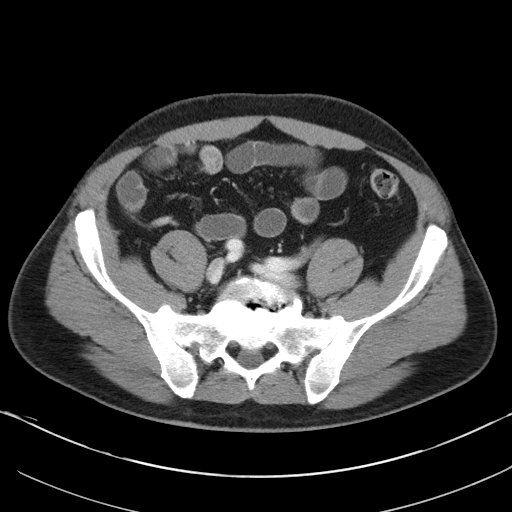
[im 41/96  soft-tissue]
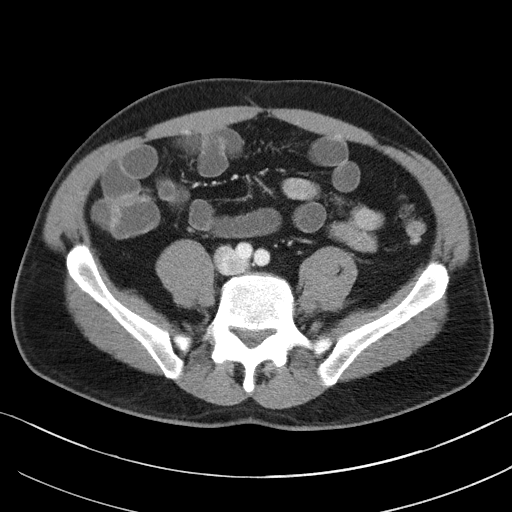
[im 51/96  soft-tissue]
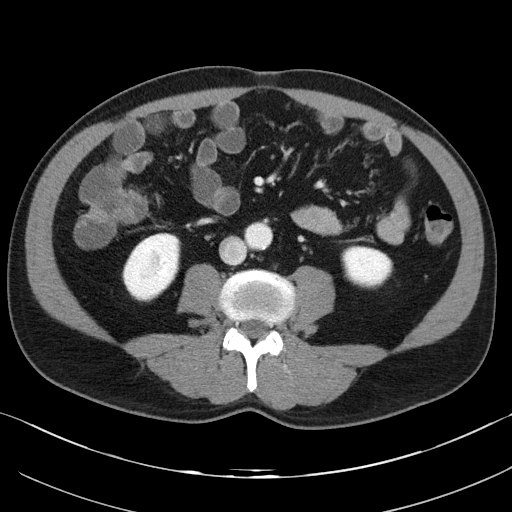
[im 56/96  soft-tissue]
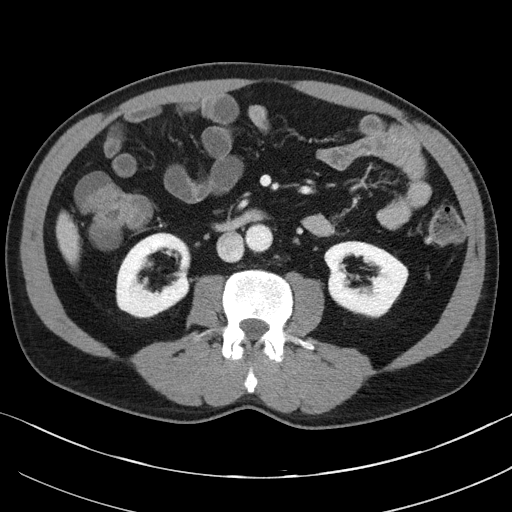
[im 61/96  soft-tissue]
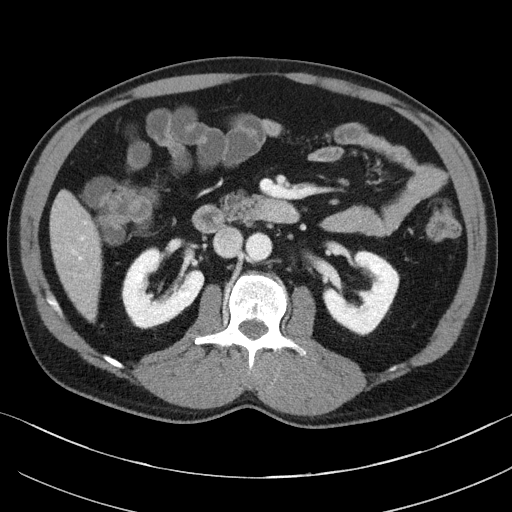
[im 61/96  bone]
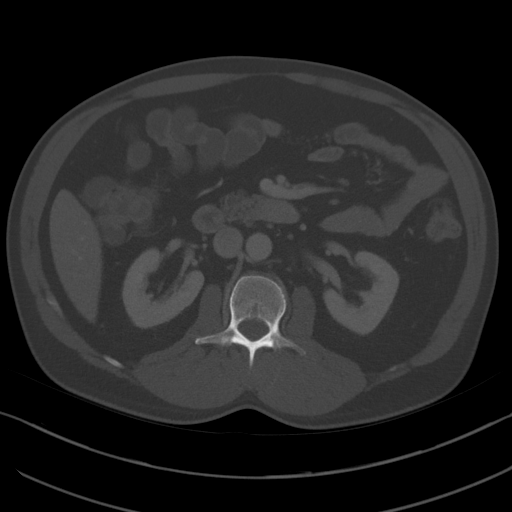
[im 71/96  soft-tissue]
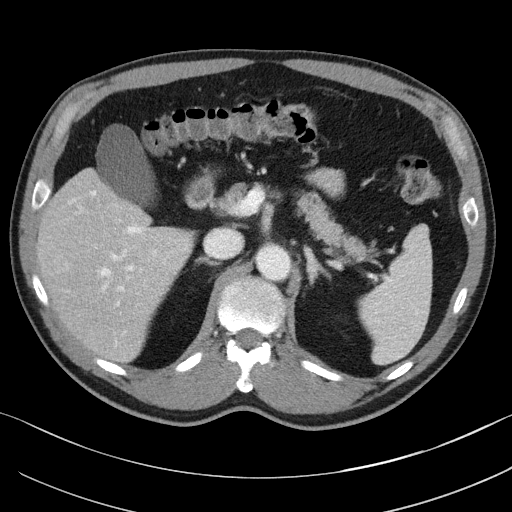
[im 76/96  soft-tissue]
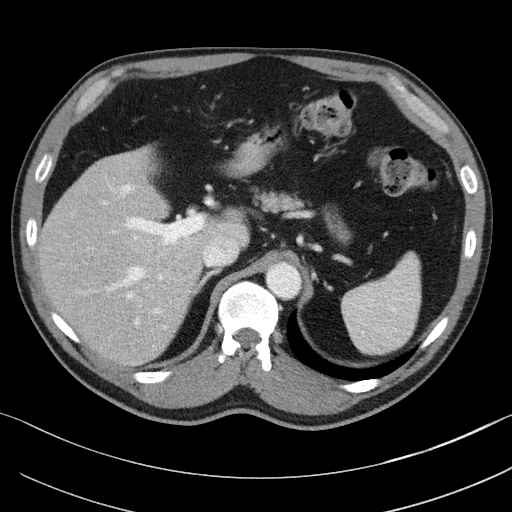
[im 81/96  soft-tissue]
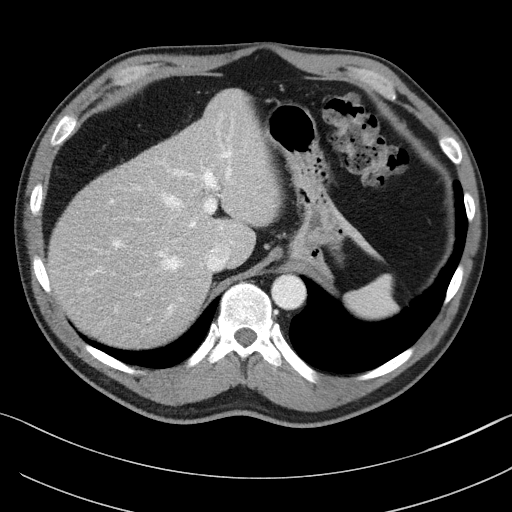
[im 91/96  soft-tissue]
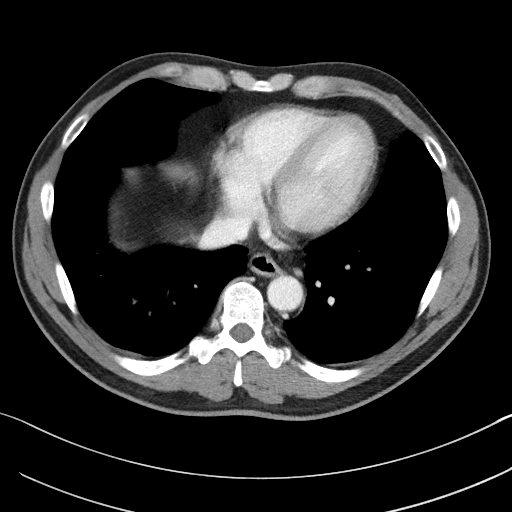

[Series 5: coronal st · coronal · 0.71mm/px · 3 of 101 slices shown]
[im 34/101  soft-tissue]
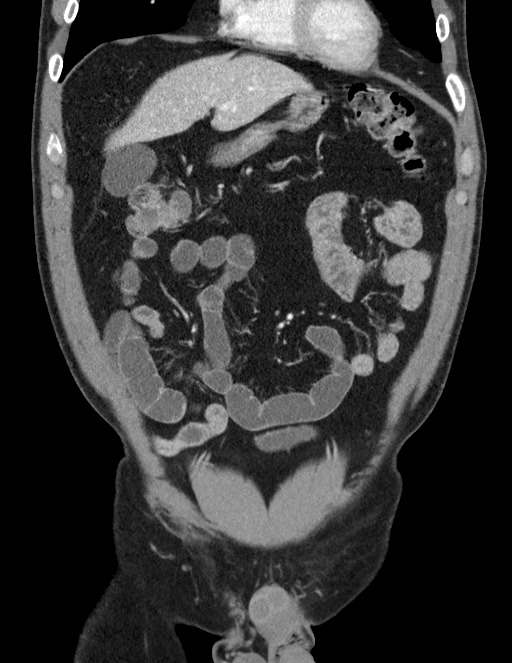
[im 45/101  soft-tissue]
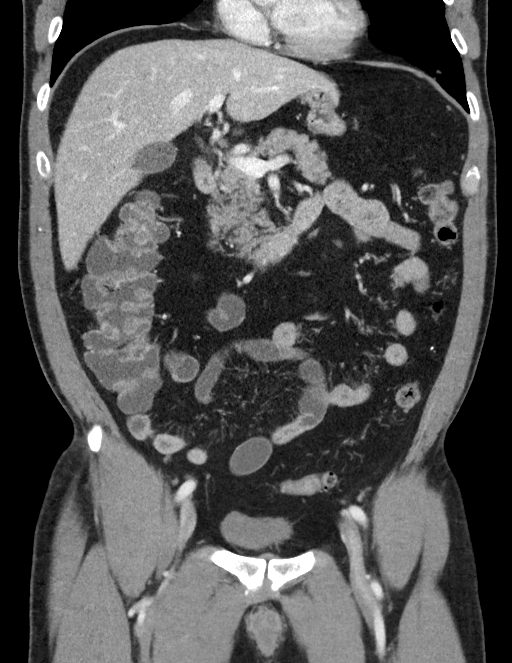
[im 56/101  soft-tissue]
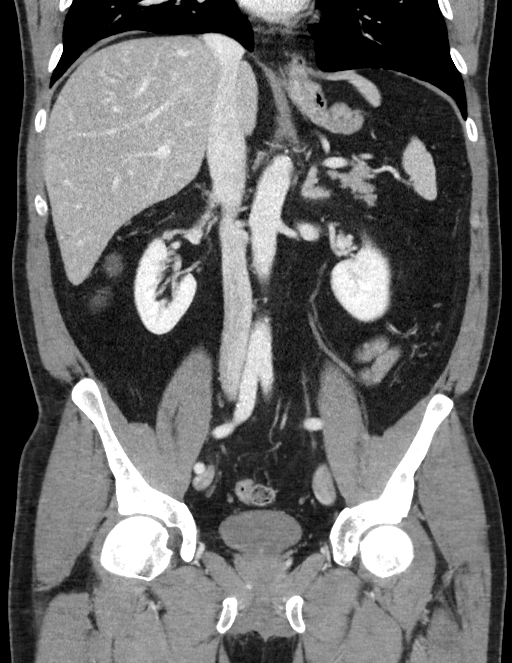

[16 of 46 positions shown; findings below may reference images not displayed]

FINDINGS: Lower chest: Minimal right basilar atelectasis is noted. The
visualized portions of the mediastinum are unremarkable.

Hepatobiliary: The liver is unremarkable in appearance. The
gallbladder is unremarkable in appearance. The common bile duct
remains normal in caliber.

Pancreas: The pancreas is within normal limits.

Spleen: The spleen is unremarkable in appearance.

Adrenals/Urinary Tract: The adrenal glands are unremarkable in
appearance. The kidneys are within normal limits. There is no
evidence of hydronephrosis. No renal or ureteral stones are
identified. No perinephric stranding is seen.

Stomach/Bowel: The stomach is unremarkable in appearance. The small
bowel is within normal limits. The appendix is normal in caliber,
without evidence of appendicitis.

Mild scattered diverticulosis is noted along the descending and
proximal sigmoid colon, without evidence of diverticulitis.

Vascular/Lymphatic: Minimal calcification is seen along the
abdominal aorta. The abdominal aorta is otherwise grossly
unremarkable. The inferior vena cava is grossly unremarkable. No
retroperitoneal lymphadenopathy is seen. No pelvic sidewall
lymphadenopathy is identified.

Reproductive: The bladder is mildly distended and grossly
unremarkable. The prostate remains normal in size.

Other: A tiny umbilical hernia is noted, containing only fat.

Musculoskeletal: No acute osseous abnormalities are identified.
Vacuum phenomenon is noted at L5-S1. The visualized musculature is
unremarkable in appearance.
IMPRESSION: 1. No acute abnormality seen within the abdomen or pelvis.
2. Mild scattered diverticulosis along the descending and proximal
sigmoid colon, without evidence of diverticulitis.
3. Tiny umbilical hernia, containing only fat.
